# Patient Record
Sex: Female | Born: 1960 | Race: White | Hispanic: No | Marital: Married | Smoking: Current every day smoker
Health system: Southern US, Community
[De-identification: ages and names within clinical notes are randomized; demographics above are authoritative.]

---

## 2004-10-16 ENCOUNTER — Encounter: Payer: Self-pay | Admitting: Family Medicine

## 2004-10-16 LAB — CONVERTED CEMR LAB

## 2005-05-06 ENCOUNTER — Ambulatory Visit: Payer: Self-pay | Admitting: Family Medicine

## 2005-05-12 ENCOUNTER — Ambulatory Visit: Payer: Self-pay | Admitting: Family Medicine

## 2005-06-07 ENCOUNTER — Ambulatory Visit: Payer: Self-pay | Admitting: Family Medicine

## 2005-07-16 ENCOUNTER — Ambulatory Visit: Payer: Self-pay | Admitting: Family Medicine

## 2005-09-14 ENCOUNTER — Ambulatory Visit: Payer: Self-pay | Admitting: Family Medicine

## 2005-09-28 ENCOUNTER — Ambulatory Visit: Payer: Self-pay | Admitting: Family Medicine

## 2005-10-12 ENCOUNTER — Ambulatory Visit: Payer: Self-pay | Admitting: Family Medicine

## 2005-10-20 ENCOUNTER — Ambulatory Visit: Payer: Self-pay | Admitting: Family Medicine

## 2005-11-15 ENCOUNTER — Ambulatory Visit: Payer: Self-pay | Admitting: Family Medicine

## 2005-11-23 DIAGNOSIS — F17200 Nicotine dependence, unspecified, uncomplicated: Secondary | ICD-10-CM

## 2005-11-23 DIAGNOSIS — I10 Essential (primary) hypertension: Secondary | ICD-10-CM | POA: Insufficient documentation

## 2005-11-23 DIAGNOSIS — N951 Menopausal and female climacteric states: Secondary | ICD-10-CM

## 2005-11-23 DIAGNOSIS — E039 Hypothyroidism, unspecified: Secondary | ICD-10-CM | POA: Insufficient documentation

## 2005-11-23 DIAGNOSIS — F339 Major depressive disorder, recurrent, unspecified: Secondary | ICD-10-CM | POA: Insufficient documentation

## 2005-12-23 ENCOUNTER — Ambulatory Visit: Payer: Self-pay | Admitting: Family Medicine

## 2005-12-30 ENCOUNTER — Encounter: Payer: Self-pay | Admitting: Family Medicine

## 2006-02-11 ENCOUNTER — Ambulatory Visit: Payer: Self-pay | Admitting: Family Medicine

## 2006-02-24 ENCOUNTER — Ambulatory Visit: Payer: Self-pay | Admitting: Family Medicine

## 2006-04-15 ENCOUNTER — Ambulatory Visit: Payer: Self-pay | Admitting: Family Medicine

## 2006-04-15 DIAGNOSIS — E049 Nontoxic goiter, unspecified: Secondary | ICD-10-CM | POA: Insufficient documentation

## 2006-04-18 LAB — CONVERTED CEMR LAB
FSH: 40 milliintl units/mL
LH: 62.2 milliintl units/mL

## 2006-05-12 ENCOUNTER — Ambulatory Visit: Payer: Self-pay | Admitting: Family Medicine

## 2006-05-20 ENCOUNTER — Encounter: Admission: RE | Admit: 2006-05-20 | Discharge: 2006-05-20 | Payer: Self-pay | Admitting: Family Medicine

## 2006-05-20 ENCOUNTER — Encounter: Payer: Self-pay | Admitting: Family Medicine

## 2006-06-30 ENCOUNTER — Encounter (INDEPENDENT_AMBULATORY_CARE_PROVIDER_SITE_OTHER): Payer: Self-pay | Admitting: *Deleted

## 2006-06-30 ENCOUNTER — Inpatient Hospital Stay (HOSPITAL_COMMUNITY): Admission: RE | Admit: 2006-06-30 | Discharge: 2006-07-02 | Payer: Self-pay | Admitting: Surgery

## 2006-06-30 ENCOUNTER — Encounter: Payer: Self-pay | Admitting: Family Medicine

## 2006-07-04 ENCOUNTER — Telehealth: Payer: Self-pay | Admitting: Family Medicine

## 2006-08-01 ENCOUNTER — Telehealth: Payer: Self-pay | Admitting: Family Medicine

## 2006-10-26 ENCOUNTER — Ambulatory Visit: Payer: Self-pay | Admitting: Family Medicine

## 2006-10-27 ENCOUNTER — Encounter: Payer: Self-pay | Admitting: Family Medicine

## 2006-10-27 DIAGNOSIS — E209 Hypoparathyroidism, unspecified: Secondary | ICD-10-CM

## 2006-11-02 ENCOUNTER — Encounter: Payer: Self-pay | Admitting: Family Medicine

## 2006-11-07 ENCOUNTER — Encounter: Payer: Self-pay | Admitting: Family Medicine

## 2006-11-16 ENCOUNTER — Encounter: Payer: Self-pay | Admitting: Family Medicine

## 2006-11-16 LAB — CONVERTED CEMR LAB
ALT: 10 units/L (ref 0–35)
AST: 14 units/L (ref 0–37)
BUN: 18 mg/dL (ref 6–23)
Calcium: 8 mg/dL — ABNORMAL LOW (ref 8.4–10.5)
Chloride: 99 meq/L (ref 96–112)
Cholesterol: 173 mg/dL (ref 0–200)
Creatinine, Ser: 0.93 mg/dL (ref 0.40–1.20)
HDL: 36 mg/dL — ABNORMAL LOW (ref >39)
TSH: 3.471 microintl units/mL (ref 0.350–5.50)
Total Bilirubin: 0.4 mg/dL (ref 0.3–1.2)
Total CHOL/HDL Ratio: 4.8
VLDL: 33 mg/dL (ref 0–40)

## 2006-11-22 ENCOUNTER — Ambulatory Visit: Payer: Self-pay | Admitting: Family Medicine

## 2006-11-22 DIAGNOSIS — R7301 Impaired fasting glucose: Secondary | ICD-10-CM | POA: Insufficient documentation

## 2006-11-24 LAB — CONVERTED CEMR LAB: Calcium, Total (PTH): 7.8 mg/dL — ABNORMAL LOW (ref 8.4–10.5)

## 2006-12-27 ENCOUNTER — Ambulatory Visit: Payer: Self-pay | Admitting: Family Medicine

## 2006-12-27 DIAGNOSIS — G909 Disorder of the autonomic nervous system, unspecified: Secondary | ICD-10-CM | POA: Insufficient documentation

## 2007-05-02 ENCOUNTER — Ambulatory Visit: Payer: Self-pay | Admitting: Family Medicine

## 2007-05-02 DIAGNOSIS — R5381 Other malaise: Secondary | ICD-10-CM | POA: Insufficient documentation

## 2007-05-02 DIAGNOSIS — R05 Cough: Secondary | ICD-10-CM

## 2007-05-02 DIAGNOSIS — L258 Unspecified contact dermatitis due to other agents: Secondary | ICD-10-CM

## 2007-05-02 DIAGNOSIS — R5383 Other fatigue: Secondary | ICD-10-CM

## 2007-05-02 DIAGNOSIS — R059 Cough, unspecified: Secondary | ICD-10-CM | POA: Insufficient documentation

## 2007-06-16 ENCOUNTER — Encounter: Payer: Self-pay | Admitting: Family Medicine

## 2007-06-16 LAB — CONVERTED CEMR LAB
Eosinophils Absolute: 0.2 10*3/uL (ref 0.0–0.7)
Eosinophils Relative: 3 % (ref 0–5)
HCT: 43.7 % (ref 36.0–46.0)
Hemoglobin: 14.8 g/dL (ref 12.0–15.0)
Lymphocytes Relative: 34 % (ref 12–46)
Lymphs Abs: 2.6 10*3/uL (ref 0.7–4.0)
MCV: 92.2 fL (ref 78.0–100.0)
Monocytes Absolute: 0.3 10*3/uL (ref 0.1–1.0)
WBC: 7.8 10*3/uL (ref 4.0–10.5)

## 2007-06-20 ENCOUNTER — Telehealth: Payer: Self-pay | Admitting: Family Medicine

## 2007-09-12 ENCOUNTER — Ambulatory Visit: Payer: Self-pay | Admitting: Family Medicine

## 2007-09-13 ENCOUNTER — Encounter: Payer: Self-pay | Admitting: Family Medicine

## 2007-09-15 LAB — CONVERTED CEMR LAB
FSH: 8.1 milliintl units/mL
LH: 12.3 milliintl units/mL
TSH: 0.671 microintl units/mL (ref 0.350–4.50)

## 2007-10-20 ENCOUNTER — Encounter: Payer: Self-pay | Admitting: Family Medicine

## 2007-11-25 IMAGING — CT CT NECK W/ CM
2 of 4 series · 10 of 20 positions shown, 13 images · IV contrast ([ID] OMNI 300)
Comparison: none

CLINICAL DATA: Goiter.  Difficulty swallowing.  Some pain. 
 CT NECK WITH CONTRAST:
TECHNIQUE: Multidetector CT imaging of the neck was performed following the standard protocol during administration of intravenous contrast.
 Contrast:  511cc Omnipaque 300.

[Series 3: soft tissue neck · axial · 0.36mm/px · z∈[+78,+265]mm · 9 of 64 slices shown, 12 images]
[im 7/64  soft-tissue]
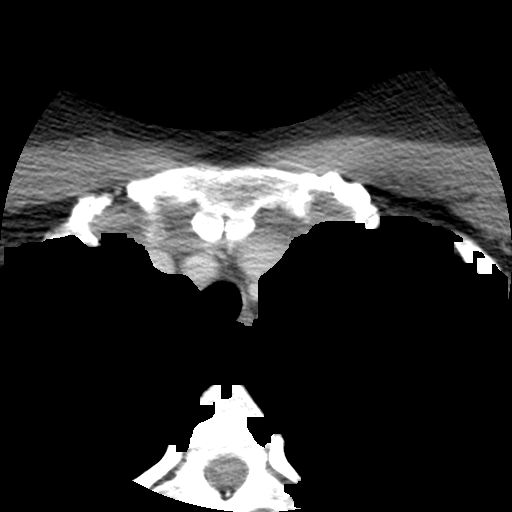
[im 7/64  bone]
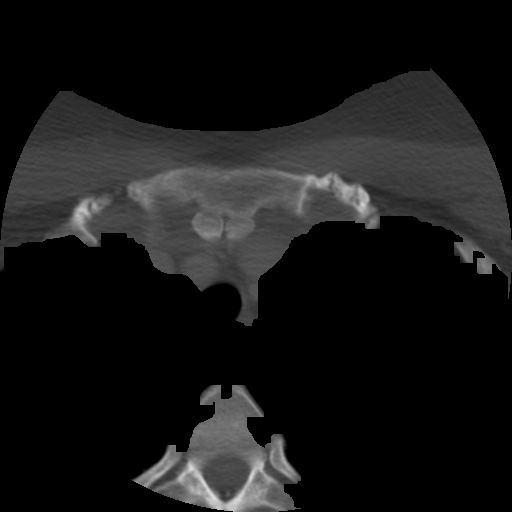
[im 13/64  bone]
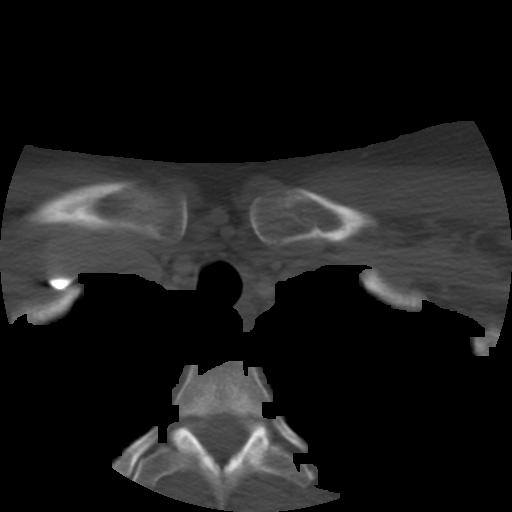
[im 19/64  bone]
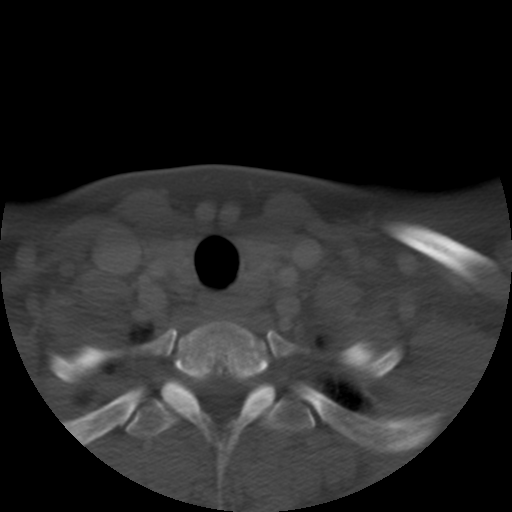
[im 26/64  bone]
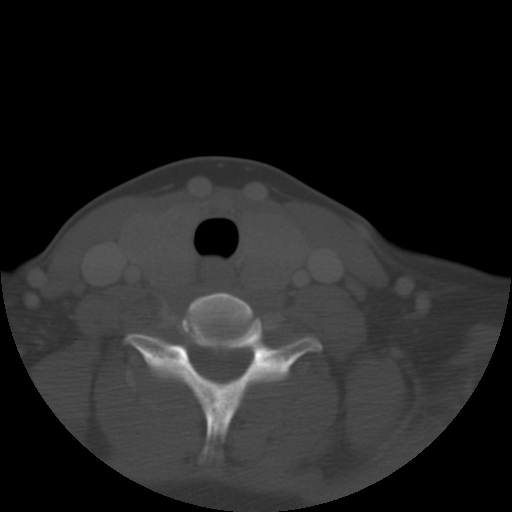
[im 32/64  soft-tissue]
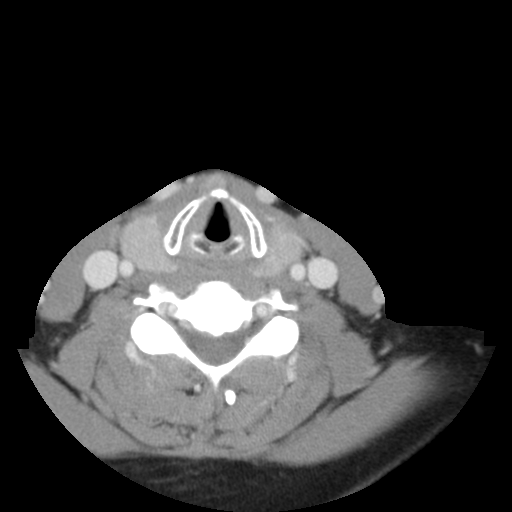
[im 32/64  bone]
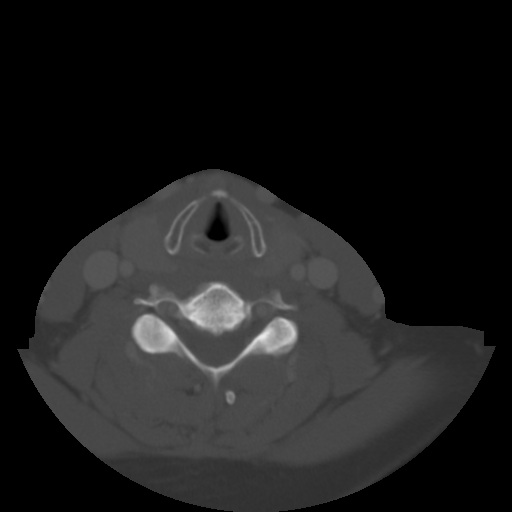
[im 38/64  bone]
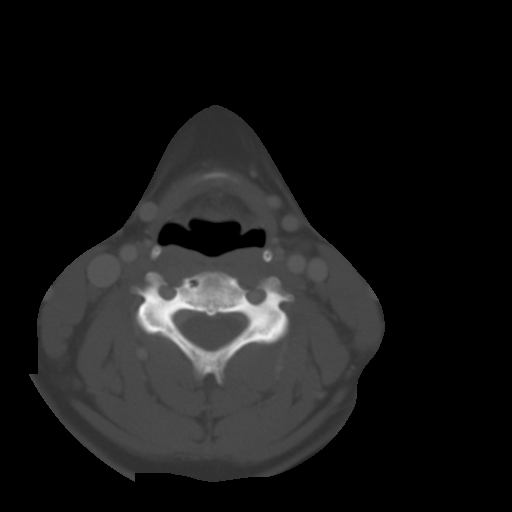
[im 45/64  bone]
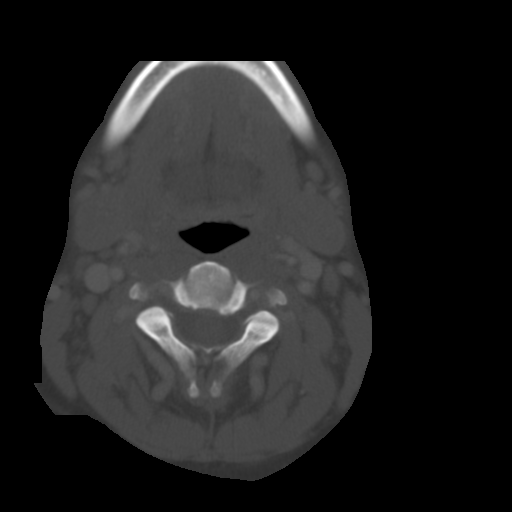
[im 51/64  bone]
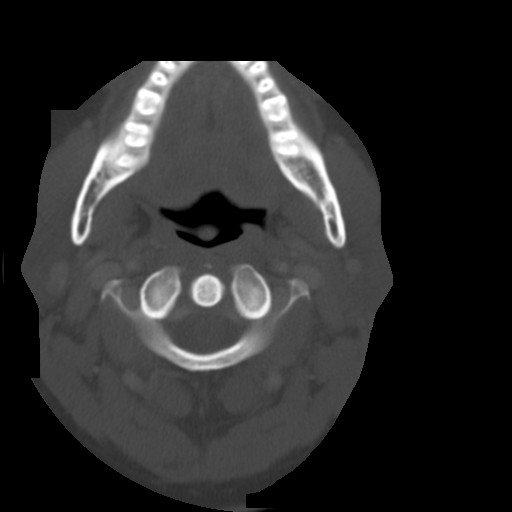
[im 57/64  soft-tissue]
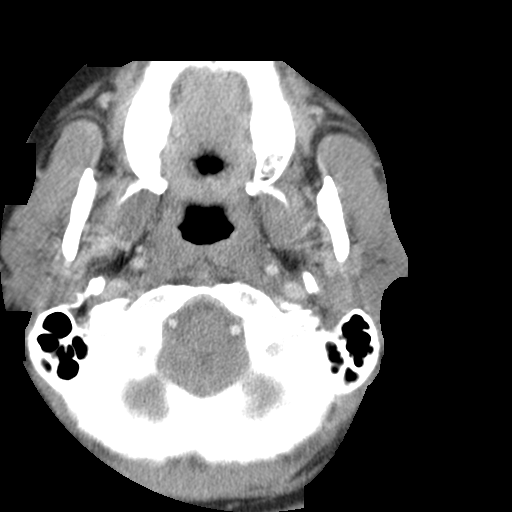
[im 57/64  bone]
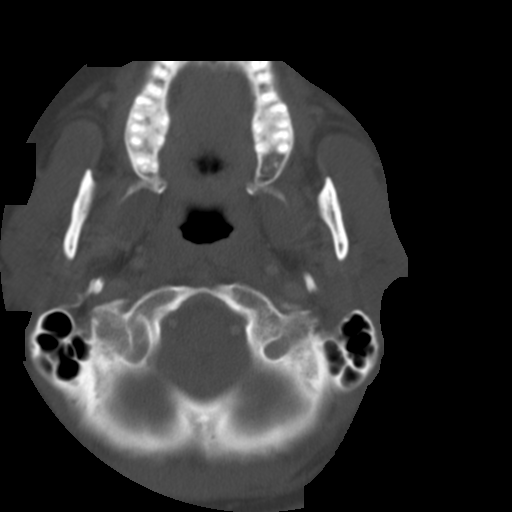

[Series 601: coronal body · coronal · 0.46mm/px · 1 of 64 slices shown]
[im 32/64  bone]
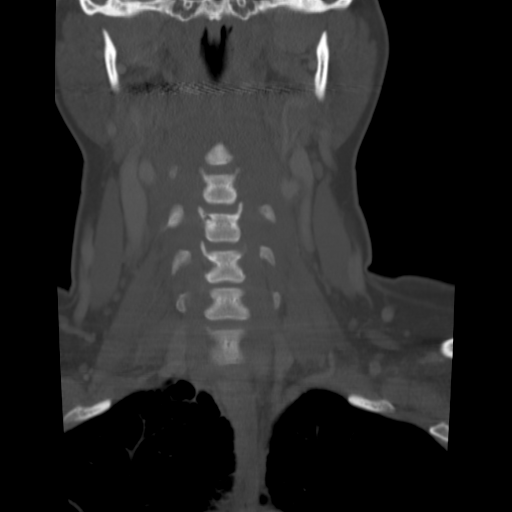

[10 of 20 positions shown; findings below may reference images not displayed]

FINDINGS: The base of the skull appears normal.  The paranasal sinuses that are visualized are clear.  The parotid glands are symmetrical and normal.  The posterior oro-nasopharynx, aryepiglottic folds, pre-epiglottic fat space, vallecula, puriform sinuses, level of the true cords, and subglottic airway appear normal.  There is diffuse enlargement of the thyroid gland.  This enlargement of the thyroid does extend somewhat posteriorly and may impinge upon the esophagus resulting in difficulty in swallowing.  Barium swallow may be helpful to assess further.  No adenopathy is seen throughout the neck.  Mild bullous changes are noted in the lung apices.  On bone window images only minimal degenerative disc disease is noted at C5-6 with very slight loss of disc space at that level.
IMPRESSION: 1.  Diffusely enlarged thyroid gland may impinge upon the esophagus and could be the reason for the patient?s dysphagia.  Consider barium swallow to assess further. 
 2.  No adenopathy or other mass throughout the neck.  
 3.  Mild bullous changes in the lung apices. 
 4.  Mild degenerative disc disease at C5-6.

## 2007-12-21 ENCOUNTER — Telehealth (INDEPENDENT_AMBULATORY_CARE_PROVIDER_SITE_OTHER): Payer: Self-pay | Admitting: *Deleted

## 2008-01-24 ENCOUNTER — Ambulatory Visit: Payer: Self-pay | Admitting: Family Medicine

## 2008-01-26 ENCOUNTER — Encounter: Payer: Self-pay | Admitting: Family Medicine

## 2008-01-29 ENCOUNTER — Encounter: Payer: Self-pay | Admitting: Family Medicine

## 2008-01-29 LAB — CONVERTED CEMR LAB
ALT: 12 units/L (ref 0–35)
AST: 18 units/L (ref 0–37)
Albumin: 4.1 g/dL (ref 3.5–5.2)
Alkaline Phosphatase: 52 units/L (ref 39–117)
Calcium, Total (PTH): 7.4 mg/dL — ABNORMAL LOW (ref 8.4–10.5)
Chloride: 106 meq/L (ref 96–112)
HDL: 39 mg/dL — ABNORMAL LOW (ref >39)
LDL Cholesterol: 97 mg/dL (ref 0–99)
PTH: 8.6 pg/mL — ABNORMAL LOW (ref 14.0–72.0)
Potassium: 4.4 meq/L (ref 3.5–5.3)
Sodium: 141 meq/L (ref 135–145)
TSH: 0.666 microintl units/mL (ref 0.350–4.50)
Total Protein: 7.1 g/dL (ref 6.0–8.3)

## 2008-02-01 ENCOUNTER — Telehealth: Payer: Self-pay | Admitting: Family Medicine

## 2008-02-01 DIAGNOSIS — R404 Transient alteration of awareness: Secondary | ICD-10-CM | POA: Insufficient documentation

## 2008-02-05 ENCOUNTER — Encounter: Payer: Self-pay | Admitting: Family Medicine

## 2008-05-24 ENCOUNTER — Ambulatory Visit: Payer: Self-pay | Admitting: Family Medicine

## 2008-05-24 DIAGNOSIS — R061 Stridor: Secondary | ICD-10-CM | POA: Insufficient documentation

## 2008-05-24 DIAGNOSIS — G4730 Sleep apnea, unspecified: Secondary | ICD-10-CM | POA: Insufficient documentation

## 2008-05-24 DIAGNOSIS — R290 Tetany: Secondary | ICD-10-CM

## 2008-05-27 ENCOUNTER — Encounter: Payer: Self-pay | Admitting: Family Medicine

## 2008-05-28 LAB — CONVERTED CEMR LAB
Calcium, Total (PTH): 7.5 mg/dL — ABNORMAL LOW (ref 8.4–10.5)
Calcium: 7.5 mg/dL — ABNORMAL LOW (ref 8.4–10.5)
Glucose, Bld: 84 mg/dL (ref 70–99)
PTH: 8.2 pg/mL — ABNORMAL LOW (ref 14.0–72.0)
Sodium: 139 meq/L (ref 135–145)

## 2008-05-29 ENCOUNTER — Telehealth: Payer: Self-pay | Admitting: Family Medicine

## 2008-12-19 ENCOUNTER — Ambulatory Visit: Payer: Self-pay | Admitting: Family Medicine

## 2008-12-19 DIAGNOSIS — R0602 Shortness of breath: Secondary | ICD-10-CM

## 2008-12-19 DIAGNOSIS — R1319 Other dysphagia: Secondary | ICD-10-CM

## 2008-12-19 LAB — CONVERTED CEMR LAB
ALT: 16 units/L (ref 0–35)
AST: 19 units/L (ref 0–37)
Basophils Relative: 1 % (ref 0–1)
Creatinine, Ser: 0.86 mg/dL (ref 0.40–1.20)
Eosinophils Absolute: 0.2 10*3/uL (ref 0.0–0.7)
Lymphs Abs: 2.9 10*3/uL (ref 0.7–4.0)
MCHC: 34 g/dL (ref 30.0–36.0)
MCV: 89.8 fL (ref 78.0–100.0)
Neutrophils Relative %: 56 % (ref 43–77)
Platelets: 275 10*3/uL (ref 150–400)
Total Bilirubin: 0.3 mg/dL (ref 0.3–1.2)
WBC: 8.1 10*3/uL (ref 4.0–10.5)

## 2008-12-20 ENCOUNTER — Encounter: Payer: Self-pay | Admitting: Family Medicine

## 2010-06-30 NOTE — Op Note (Signed)
NAME:  Susan Clayton, Susan Clayton NO.:  [id number]   MEDICAL RECORD NO.:  [id number]          PATIENT TYPE:  AMB   LOCATION:  SDS                          FACILITY:  [facility]   PHYSICIAN:  Velora Heckler, MD      DATE OF BIRTH:  [birth year]   DATE OF PROCEDURE:  DATE OF DISCHARGE:                               OPERATIVE REPORT   PREOPERATIVE DIAGNOSES:  Thyroid goiter with compressive symptoms,  Hashimoto's thyroiditis.   POSTOPERATIVE DIAGNOSES:  Thyroid goiter with compressive symptoms,  Hashimoto's thyroiditis.   PROCEDURE PERFORMED:  Total thyroidectomy.   SURGEON:  Velora Heckler, MD, FACS   ASSISTANT:  Karie Soda, MD   ANESTHESIA:  General per Dr. Kipp Brood.   ESTIMATED BLOOD LOSS:  Minimal.   PREPARATION:  Betadine.   COMPLICATIONS:  None.   INDICATIONS:  The patient is a 50 year old white female from [place], [place].  She has been managed with thyroid goiter with  compressive symptoms and underlying Hashimoto's thyroiditis for the past  few years.  She is on thyroid hormone replacement.  She has developed  pressure sensation in the neck, dysphagia, and air hunger at night.  She  now comes to surgery for thyroidectomy.   PROCEDURE:  Procedure was done in OR #17 at the [facility].  The patient was brought to the operating room, placed in  supine position on the operating room table.  Following administration  of general anesthesia the patient is positioned and then prepped and  draped in the usual strict aseptic fashion.   After ascertaining that an adequate level of anesthesia had been  obtained, a Kocher incision was made with a #15 blade.  Dissection was  carried through subcutaneous tissues and platysma.  Hemostasis was  obtained with electrocautery.  Skin flaps were elevated cephalad and  caudad from the thyroid notch to the sternal notch.  A Mahorner self-  retaining retractor was placed for exposure.  External  jugular veins  were divided between hemostats and ligated with 2-0 silk ties.  Strap  muscles were incised in the midline.  Dissection was begun on the left  side of the neck.  Strap muscles were reflected laterally.  The left  thyroid lobe was moderately enlarged.  It is multinodular.  Strap  muscles are somewhat adherent, consistent with an inflammatory process.  Using a Barista, the lobe was exposed.  Middle thyroid vein is  divided between medium Ligaclips.  Inferior venous tributaries were  divided with the harmonic scalpel.  There are several inflammatory type  lymph nodes noted inferiorly bilaterally.  Superior pole vessels are  dissected out and divided between medium Ligaclips with the harmonic  scalpel.  Gland is rolled anteriorly.  Branches of the inferior thyroid  artery are divided between small Ligaclips with the harmonic scalpel.  Parathyroid tissue is identified and preserved.  Recurrent nerve was  identified and preserved.  Ligament of Allyson Sabal was transected with  electrocautery and the gland is rolled up and onto the anterior trachea.  There is a moderate to large pyramidal  lobe which extends approximately  5 cm cephalad.  This was gently dissected out using the electrocautery  for hemostasis.  It was included with the specimen.  Isthmus was  mobilized across the midline.  Dry pack is placed in the left neck.   Next we turned our attention to the right thyroid lobe.  Again strap  muscles were reflected laterally.  They are somewhat adherent to the  gland again consistent with thyroiditis.  Large venous tributaries were  divided between medium Ligaclips with the harmonic scalpel.  Superior  pole vessels are dissected out and divided between medium Ligaclips with  the harmonic scalpel.  Gland is rolled anteriorly.  Branches of the  inferior thyroid artery are divided between small Ligaclips.  Parathyroid tissue is identified and preserved.  The right recurrent   laryngeal nerve was identified and preserved.  Inferior venous  tributaries were divided with the harmonic scalpel.  Ligament of Allyson Sabal  is transected with electrocautery and the gland is rolled up and onto  the anterior trachea from which it is completely excised with the  electrocautery.  Sutures were used to mark the right superior pole.  The  entire thyroid specimen is submitted to pathology for review.   Neck is irrigated bilaterally with warm saline.  Good hemostasis was  noted.  Surgicel was placed over the area of the recurrent nerves  bilaterally.  Strap muscles were reapproximated in the midline with  interrupted 3-0 Vicryl sutures.  Platysma was closed with interrupted 3-  0 Vicryl sutures.  Skin was closed with running 4-0 Vicryl subcuticular  suture.  Wound is washed and dried and Benzoin and Steri-Strips were  applied.  Sterile dressings were applied.  The patient is awakened from  anesthesia and brought to the recovery room in stable condition.  The  patient tolerated the procedure well.      Velora Heckler, MD  Electronically Signed     TMG/MEDQ  D:  06/30/2006  T:  06/30/2006  Job:  [id number]   cc:   Seymour Bars, D.O.

## 2012-12-12 LAB — BASIC METABOLIC PANEL
BUN: 12 mg/dL (ref 4–21)
Creatinine: 0.8 mg/dL (ref 0.5–1.1)
Glucose: 84 mg/dL
Potassium: 4.2 mmol/L (ref 3.4–5.3)
SODIUM: 143 mmol/L (ref 137–147)

## 2012-12-12 LAB — TSH: TSH: 5.12 u[IU]/mL (ref 0.41–5.90)

## 2012-12-12 LAB — LIPID PANEL
Cholesterol: 179 mg/dL (ref 0–200)
HDL: 36 mg/dL (ref 35–70)
LDL CALC: 91 mg/dL
Triglycerides: 248 mg/dL — AB (ref 40–160)

## 2012-12-12 LAB — CBC AND DIFFERENTIAL
HEMATOCRIT: 43 % (ref 36–46)
Hemoglobin: 14.8 g/dL (ref 12.0–16.0)
PLATELETS: 302 10*3/uL (ref 150–399)

## 2012-12-12 LAB — VITAMIN D 25 HYDROXY (VIT D DEFICIENCY, FRACTURES): Vit D, 25-Hydroxy: 22

## 2013-03-06 LAB — CMP14+LP+1AC+CBC/D/PLT+TSH
CALCIUM: 7.6 mg/dL
T4+FREE T4: 12.9

## 2013-03-06 LAB — LIPID PANEL
Cholesterol: 195 mg/dL (ref 0–200)
HDL: 42 mg/dL (ref 35–70)
LDL Cholesterol: 124 mg/dL
TRIGLYCERIDES: 146 mg/dL (ref 40–160)

## 2013-03-06 LAB — CBC AND DIFFERENTIAL
HEMATOCRIT: 43 % (ref 36–46)
HEMOGLOBIN: 14.8 g/dL (ref 12.0–16.0)

## 2013-03-06 LAB — BASIC METABOLIC PANEL
BUN: 10 mg/dL (ref 4–21)
Creatinine: 0.9 mg/dL (ref 0.5–1.1)
Glucose: 76 mg/dL
Potassium: 4.1 mmol/L (ref 3.4–5.3)
SODIUM: 142 mmol/L (ref 137–147)

## 2013-03-06 LAB — TSH: TSH: 18.72 u[IU]/mL — AB (ref 0.41–5.90)

## 2013-03-06 LAB — HEPATIC FUNCTION PANEL
ALT: 12 U/L (ref 7–35)
AST: 17 U/L (ref 13–35)

## 2013-05-22 LAB — TSH: TSH: 0.82 u[IU]/mL (ref 0.41–5.90)

## 2013-11-02 ENCOUNTER — Ambulatory Visit: Payer: Self-pay | Admitting: Physician Assistant

## 2013-11-14 ENCOUNTER — Encounter: Payer: Self-pay | Admitting: Physician Assistant

## 2013-11-14 ENCOUNTER — Ambulatory Visit (INDEPENDENT_AMBULATORY_CARE_PROVIDER_SITE_OTHER): Payer: Self-pay | Admitting: Physician Assistant

## 2013-11-14 VITALS — BP 158/101 | HR 77 | Ht 69.0 in | Wt 214.0 lb

## 2013-11-14 DIAGNOSIS — R5381 Other malaise: Secondary | ICD-10-CM

## 2013-11-14 DIAGNOSIS — F3289 Other specified depressive episodes: Secondary | ICD-10-CM

## 2013-11-14 DIAGNOSIS — E039 Hypothyroidism, unspecified: Secondary | ICD-10-CM

## 2013-11-14 DIAGNOSIS — R5383 Other fatigue: Secondary | ICD-10-CM

## 2013-11-14 DIAGNOSIS — Z131 Encounter for screening for diabetes mellitus: Secondary | ICD-10-CM

## 2013-11-14 DIAGNOSIS — Z13220 Encounter for screening for lipoid disorders: Secondary | ICD-10-CM

## 2013-11-14 DIAGNOSIS — F329 Major depressive disorder, single episode, unspecified: Secondary | ICD-10-CM

## 2013-11-14 DIAGNOSIS — I10 Essential (primary) hypertension: Secondary | ICD-10-CM

## 2013-11-14 DIAGNOSIS — R0602 Shortness of breath: Secondary | ICD-10-CM

## 2013-11-14 DIAGNOSIS — F32A Depression, unspecified: Secondary | ICD-10-CM

## 2013-11-14 MED ORDER — AZILSARTAN MEDOXOMIL 80 MG PO TABS: ORAL_TABLET | ORAL | Status: AC

## 2013-11-14 MED ORDER — ALBUTEROL SULFATE HFA 108 (90 BASE) MCG/ACT IN AERS: 2.0000 | INHALATION_SPRAY | Freq: Four times a day (QID) | RESPIRATORY_TRACT | Status: AC | PRN

## 2013-11-14 MED ORDER — LEVOTHYROXINE SODIUM 200 MCG PO TABS: 200.0000 ug | ORAL_TABLET | Freq: Every day | ORAL | Status: AC

## 2013-11-14 MED ORDER — DULOXETINE HCL 60 MG PO CPEP: 60.0000 mg | ORAL_CAPSULE | Freq: Every day | ORAL | Status: AC

## 2013-11-14 NOTE — Progress Notes (Signed)
   Subjective:    Patient ID: Susan Clayton, female    DOB: 04-29-1960, 53 y.o.   MRN: [id number]  HPI Pt is a 53 yo female who has been out of medication for at least 4 weeks and needs to establish care and get refills.   .. Active Ambulatory Problems    Diagnosis Date Noted  . GOITER NOS 04/15/2006  . HYPOTHYROIDISM, UNSPECIFIED 11/23/2005  . HYPOPARATHYROIDISM 10/27/2006  . DEPRESSION, MAJOR, RECURRENT 11/23/2005  . TOBACCO DEPENDENCE 11/23/2005  . CAROTIDYNIA 12/27/2006  . HYPERTENSION, BENIGN SYSTEMIC 11/23/2005  . Symptomatic Menopausal or Female Climacteric States 11/23/2005  . CONTACT DERMATITIS&OTH ECZEMA DUE OTH SPEC AGENT 05/02/2007  . SLEEPINESS 02/01/2008  . SLEEP APNEA 05/24/2008  . FATIGUE 05/02/2007  . CARPOPEDAL SPASM 05/24/2008  . DYSPNEA 12/19/2008  . STRIDOR 05/24/2008  . COUGH 05/02/2007  . OTHER DYSPHAGIA 12/19/2008  . IMPAIRED FASTING GLUCOSE 11/22/2006  . Depression 11/14/2013   Resolved Ambulatory Problems    Diagnosis Date Noted  . No Resolved Ambulatory Problems   No Additional Past Medical History   .Marland Kitchen.No family history on file. .. History   Social History  . Marital Status: Married    Spouse Name: N/A    Number of Children: N/A  . Years of Education: N/A   Occupational History  . Not on file.   Social History Main Topics  . Smoking status: Current Every Day Smoker -- 1.00 packs/day for 40 years    Types: Cigarettes  . Smokeless tobacco: Not on file  . Alcohol Use: No  . Drug Use: No  . Sexual Activity: Not on file   Other Topics Concern  . Not on file   Social History Narrative  . No narrative on file   Hypothyroidism- not taking medications. Needs refills.   Asthma- does not have rescue inhaler. Currently not needing inhaler. Feels like wheezing and breathing controlled.    HTN- not currently taking BP medication but nothing has been able to control BP.   She admits to being very fatigued. Previously tested for sleep  apnea and negative. She does not feel any more depressed. She is on cymbalta.      Review of Systems  All other systems reviewed and are negative.      Objective:   Physical Exam  Constitutional: She is oriented to person, place, and time. She appears well-developed and well-nourished.  HENT:  Head: Normocephalic and atraumatic.  Cardiovascular: Normal rate, regular rhythm and normal heart sounds.   Pulmonary/Chest: Effort normal and breath sounds normal.  Neurological: She is alert and oriented to person, place, and time.  Skin: Skin is dry.  Psychiatric: She has a normal mood and affect. Her behavior is normal.          Assessment & Plan:  Hypothyroidism- refills of levothyroxine sent. Lab slip printed out to have drawn in 3 weeks. Will go over in 4 weeks.   HTN- samples of edarbi 80mcg given. Will recheck in 4 weeks.   Asthma- rescue inhaler sent to pharmacy to use as needed.   Fatigued- could be a combination of not being on medications and ongoing conditions. Will order fatigue  labs. Discussed with patient will tackle fatigue once we get other conditions managed.   Tobacco dependence- not interested in quiting smoking today.   Pt declined colonoscopy, mammogram and flu shot today. Pt aware of risk but does not want to proceed with these screenings.   Needs lipid screening.

## 2013-11-14 NOTE — Patient Instructions (Addendum)
  Albuterol inhaler at needed for SOB.

## 2013-11-18 ENCOUNTER — Encounter: Payer: Self-pay | Admitting: Physician Assistant

## 2013-12-12 ENCOUNTER — Ambulatory Visit: Payer: Self-pay | Admitting: Physician Assistant

## 2014-05-27 ENCOUNTER — Encounter: Payer: Self-pay | Admitting: Physician Assistant
